# Patient Record
Sex: Male | Born: 1945 | Race: White | Hispanic: No | Marital: Married | Smoking: Never smoker
Health system: Southern US, Community
[De-identification: ages and names within clinical notes are randomized; demographics above are authoritative.]

## PROBLEM LIST (undated history)

## (undated) DIAGNOSIS — C801 Malignant (primary) neoplasm, unspecified: Secondary | ICD-10-CM

## (undated) DIAGNOSIS — N200 Calculus of kidney: Secondary | ICD-10-CM

## (undated) DIAGNOSIS — Z973 Presence of spectacles and contact lenses: Secondary | ICD-10-CM

## (undated) DIAGNOSIS — M1990 Unspecified osteoarthritis, unspecified site: Secondary | ICD-10-CM

## (undated) DIAGNOSIS — J302 Other seasonal allergic rhinitis: Secondary | ICD-10-CM

## (undated) HISTORY — PX: COLONOSCOPY: SHX174

## (undated) HISTORY — PX: SKIN CANCER EXCISION: SHX779

## (undated) HISTORY — PX: RECONSTRUCTION OF NOSE: SHX2301

## (undated) HISTORY — PX: BACK SURGERY: SHX140

---

## 2006-08-17 ENCOUNTER — Ambulatory Visit: Payer: Self-pay | Admitting: Family Medicine

## 2007-05-19 ENCOUNTER — Ambulatory Visit: Payer: Self-pay | Admitting: Gastroenterology

## 2007-10-28 ENCOUNTER — Ambulatory Visit: Payer: Self-pay | Admitting: Specialist

## 2007-11-04 ENCOUNTER — Ambulatory Visit: Payer: Self-pay | Admitting: Specialist

## 2008-01-09 IMAGING — CR DG KNEE COMPLETE 4+V*R*
1 series · 4 of 4 positions shown · non-contrast
Comparison: none

REASON FOR EXAM: Contusion and sprain-Fax reort to Gery Cinco, RN
[phone]
COMMENTS:

PROCEDURE:     DXR - DXR KNEE RT COMP WITH OBLIQUES  - August 17, 2006  [DATE]
RESULT:     AP, lateral and oblique views were obtained. No fracture,
dislocation or other acute bony abnormality is seen. The knee joint space is
well maintained. The patella is intact.

[Series 1: view not recorded · 0.17mm/px · 4 of 4 slices shown]
[im 1/4]
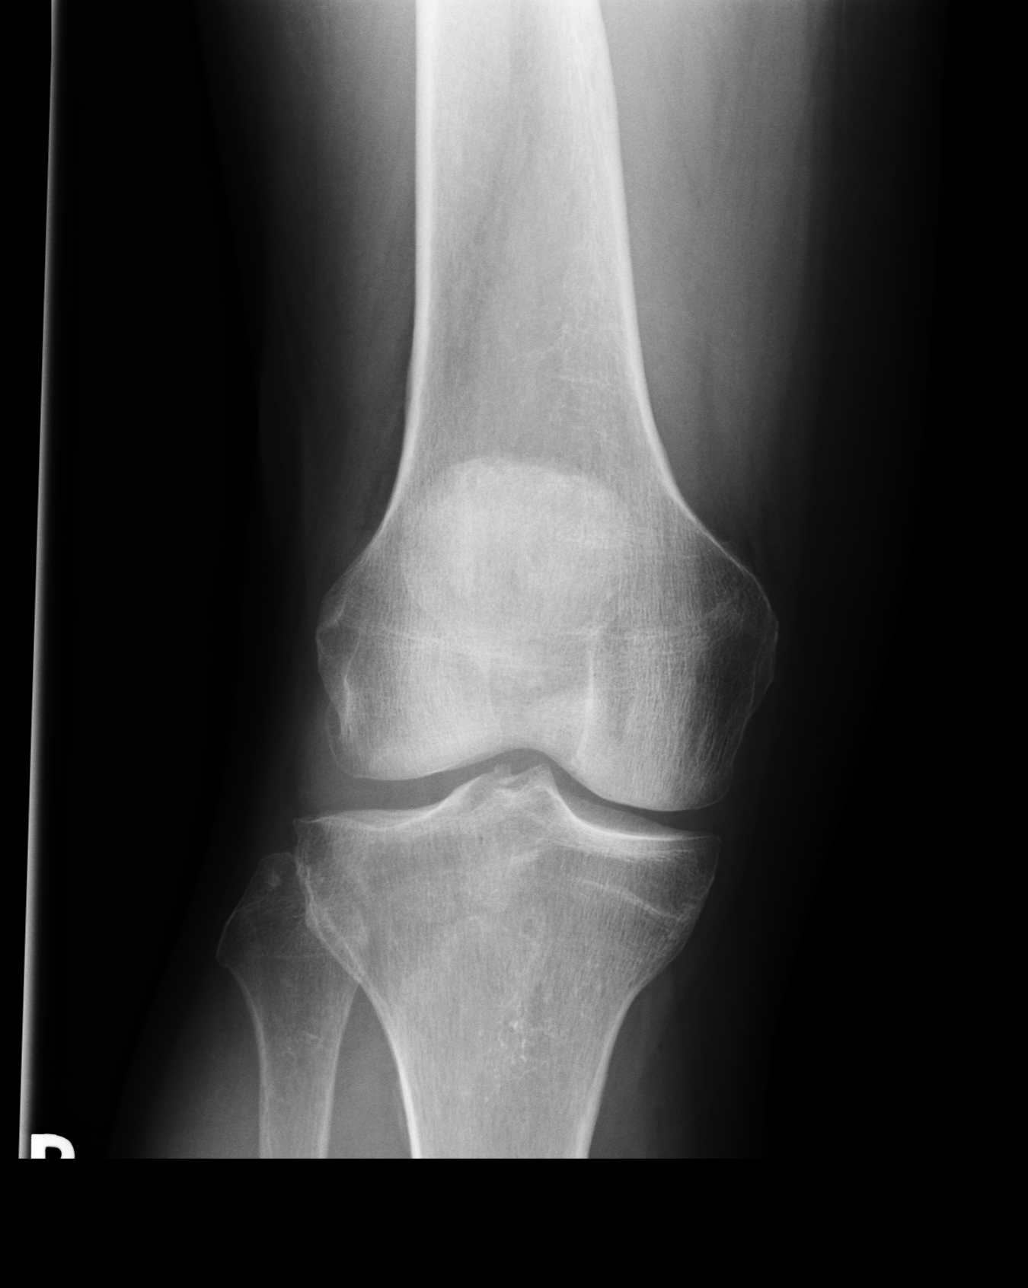
[im 2/4]
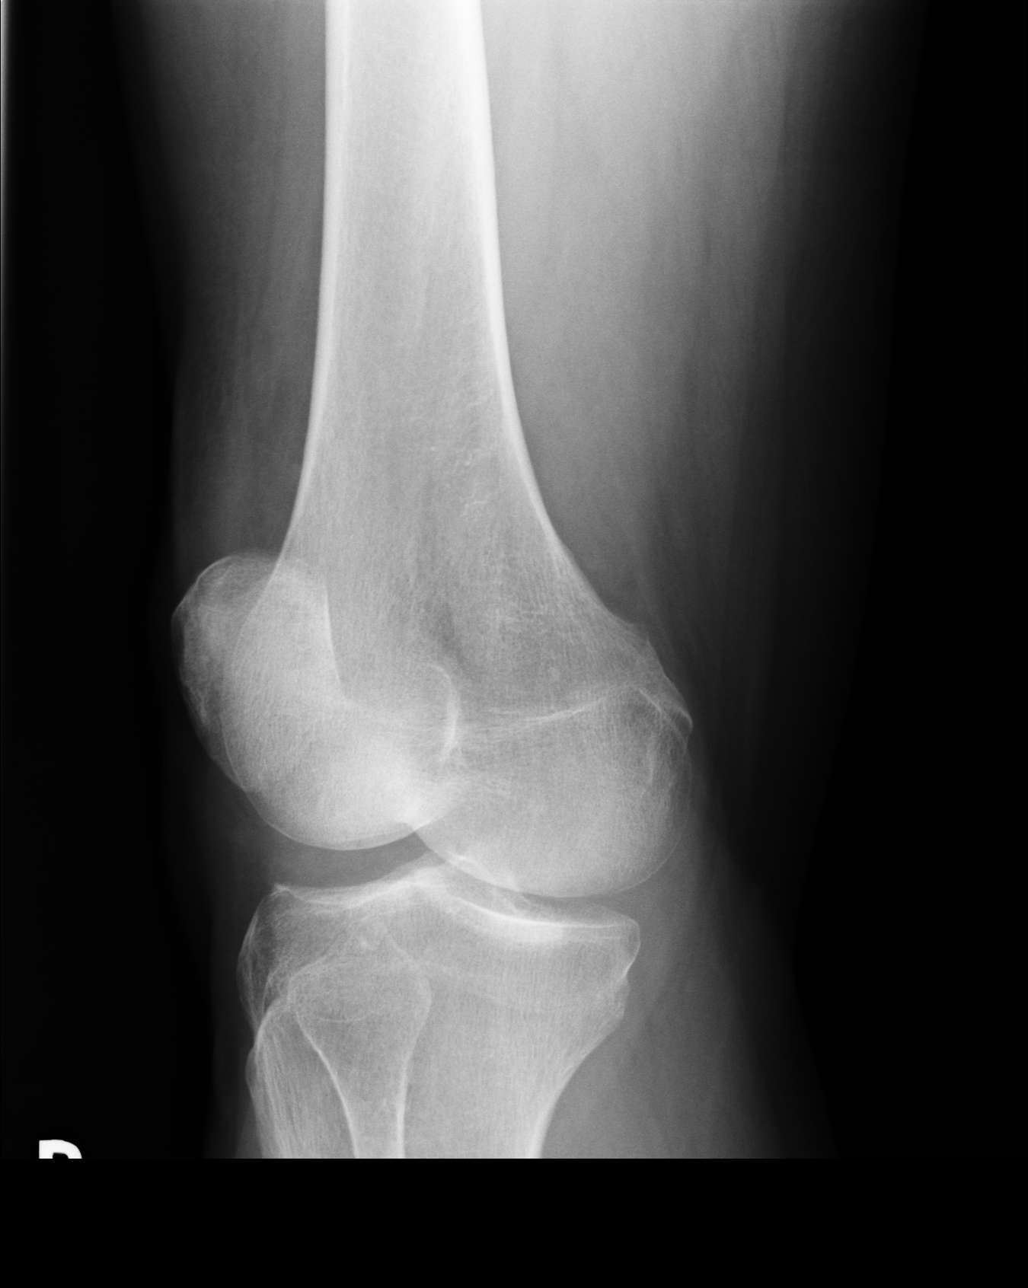
[im 3/4]
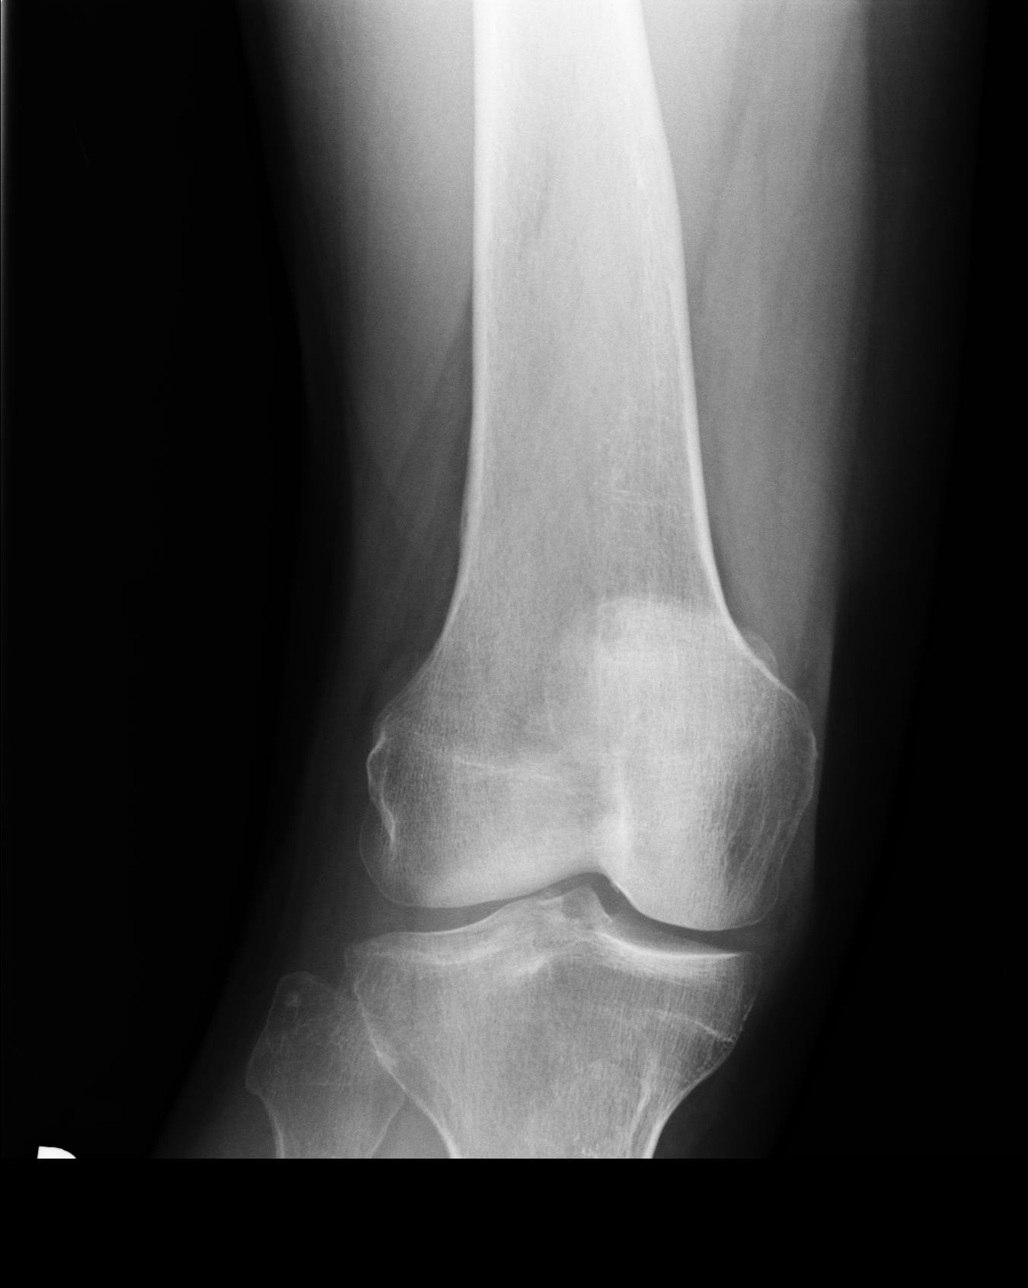
[im 4/4]
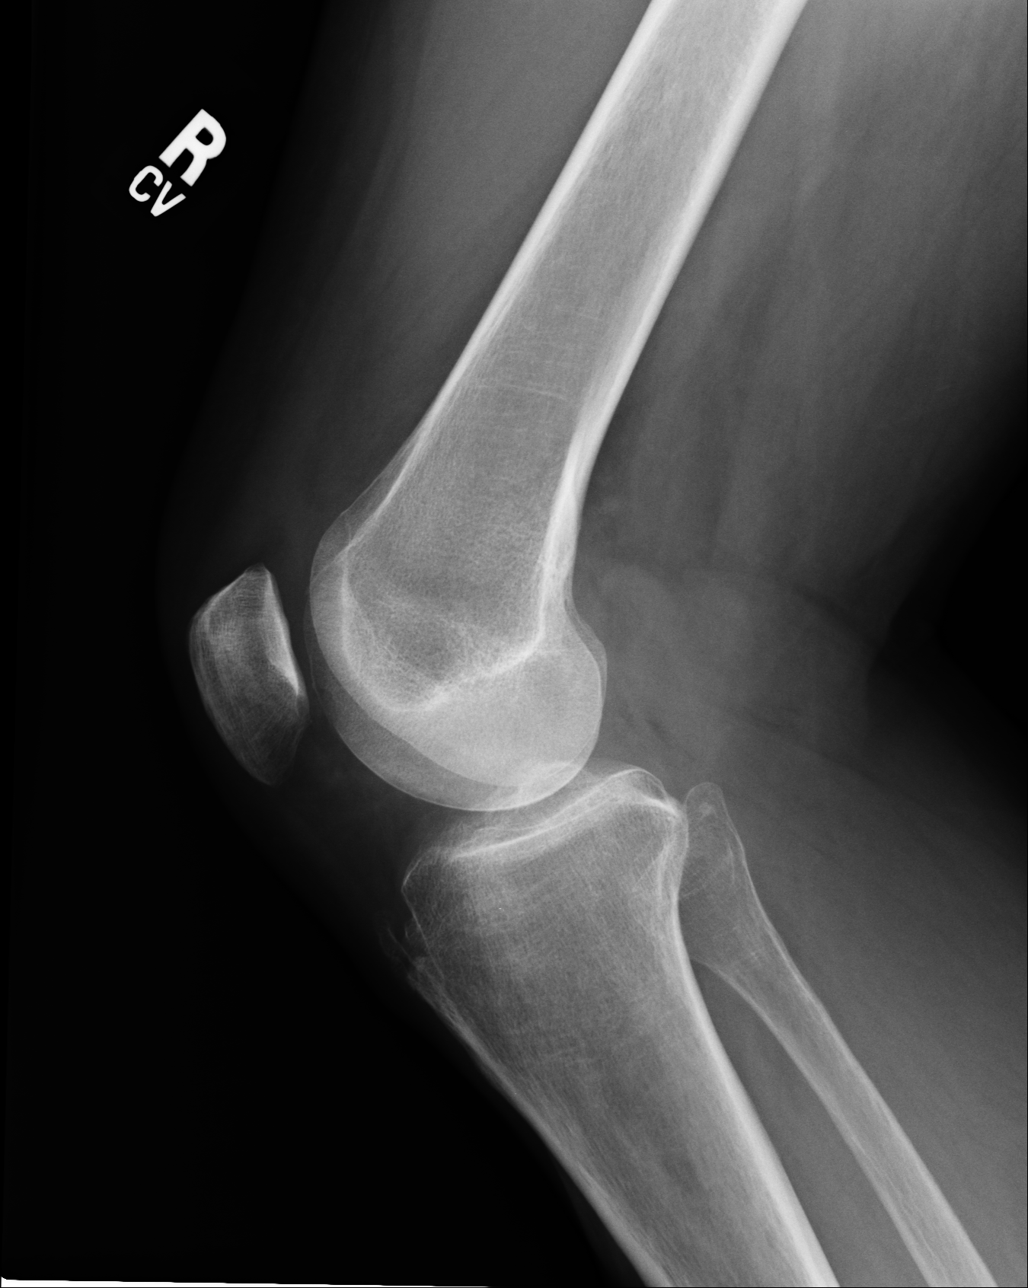

[4 of 4 positions shown; findings below may reference images not displayed]

IMPRESSION: No acute bony abnormalities are identified.

## 2014-09-13 DIAGNOSIS — R0789 Other chest pain: Secondary | ICD-10-CM | POA: Insufficient documentation

## 2016-04-04 DIAGNOSIS — L821 Other seborrheic keratosis: Secondary | ICD-10-CM | POA: Diagnosis not present

## 2016-04-04 DIAGNOSIS — Z85828 Personal history of other malignant neoplasm of skin: Secondary | ICD-10-CM | POA: Diagnosis not present

## 2016-04-04 DIAGNOSIS — D485 Neoplasm of uncertain behavior of skin: Secondary | ICD-10-CM | POA: Diagnosis not present

## 2016-06-05 DIAGNOSIS — H2513 Age-related nuclear cataract, bilateral: Secondary | ICD-10-CM | POA: Diagnosis not present

## 2016-06-23 DIAGNOSIS — N200 Calculus of kidney: Secondary | ICD-10-CM | POA: Diagnosis not present

## 2016-06-23 DIAGNOSIS — E785 Hyperlipidemia, unspecified: Secondary | ICD-10-CM | POA: Diagnosis not present

## 2016-06-23 DIAGNOSIS — J309 Allergic rhinitis, unspecified: Secondary | ICD-10-CM | POA: Diagnosis not present

## 2016-06-23 DIAGNOSIS — Z1212 Encounter for screening for malignant neoplasm of rectum: Secondary | ICD-10-CM | POA: Diagnosis not present

## 2016-06-23 DIAGNOSIS — Z0000 Encounter for general adult medical examination without abnormal findings: Secondary | ICD-10-CM | POA: Diagnosis not present

## 2016-06-23 DIAGNOSIS — M5134 Other intervertebral disc degeneration, thoracic region: Secondary | ICD-10-CM | POA: Diagnosis not present

## 2016-10-01 DIAGNOSIS — I788 Other diseases of capillaries: Secondary | ICD-10-CM | POA: Diagnosis not present

## 2017-02-25 DIAGNOSIS — H43813 Vitreous degeneration, bilateral: Secondary | ICD-10-CM | POA: Diagnosis not present

## 2017-04-03 DIAGNOSIS — D485 Neoplasm of uncertain behavior of skin: Secondary | ICD-10-CM | POA: Diagnosis not present

## 2017-04-03 DIAGNOSIS — L570 Actinic keratosis: Secondary | ICD-10-CM | POA: Diagnosis not present

## 2017-04-03 DIAGNOSIS — Z08 Encounter for follow-up examination after completed treatment for malignant neoplasm: Secondary | ICD-10-CM | POA: Diagnosis not present

## 2017-04-03 DIAGNOSIS — C44329 Squamous cell carcinoma of skin of other parts of face: Secondary | ICD-10-CM | POA: Diagnosis not present

## 2017-04-03 DIAGNOSIS — L821 Other seborrheic keratosis: Secondary | ICD-10-CM | POA: Diagnosis not present

## 2017-04-03 DIAGNOSIS — Z85828 Personal history of other malignant neoplasm of skin: Secondary | ICD-10-CM | POA: Diagnosis not present

## 2017-04-03 DIAGNOSIS — X32XXXA Exposure to sunlight, initial encounter: Secondary | ICD-10-CM | POA: Diagnosis not present

## 2017-04-10 DIAGNOSIS — C44329 Squamous cell carcinoma of skin of other parts of face: Secondary | ICD-10-CM | POA: Diagnosis not present

## 2017-04-10 DIAGNOSIS — L905 Scar conditions and fibrosis of skin: Secondary | ICD-10-CM | POA: Diagnosis not present

## 2017-06-08 DIAGNOSIS — H25813 Combined forms of age-related cataract, bilateral: Secondary | ICD-10-CM | POA: Diagnosis not present

## 2017-06-24 DIAGNOSIS — E785 Hyperlipidemia, unspecified: Secondary | ICD-10-CM | POA: Diagnosis not present

## 2017-06-24 DIAGNOSIS — Z0000 Encounter for general adult medical examination without abnormal findings: Secondary | ICD-10-CM | POA: Diagnosis not present

## 2017-06-24 DIAGNOSIS — N400 Enlarged prostate without lower urinary tract symptoms: Secondary | ICD-10-CM | POA: Diagnosis not present

## 2017-06-24 DIAGNOSIS — M5134 Other intervertebral disc degeneration, thoracic region: Secondary | ICD-10-CM | POA: Diagnosis not present

## 2017-07-01 ENCOUNTER — Other Ambulatory Visit: Payer: Self-pay

## 2017-07-01 DIAGNOSIS — Z1211 Encounter for screening for malignant neoplasm of colon: Secondary | ICD-10-CM

## 2017-07-07 ENCOUNTER — Telehealth: Payer: Self-pay | Admitting: Gastroenterology

## 2017-07-07 NOTE — Telephone Encounter (Signed)
Patient LVM that he needs to r/s his procedure on 07/17/17 with Dr. Allen Norris.

## 2017-07-08 ENCOUNTER — Telehealth: Payer: Self-pay

## 2017-07-08 NOTE — Telephone Encounter (Signed)
Rescheduled patients colonoscopy to June 17th-patient requested.  Debbie at [facility] has been informed of change.

## 2017-07-08 NOTE — Telephone Encounter (Signed)
Returned patients call to reschedule his colonoscopy.  LVM for him to call me back.

## 2017-07-10 DIAGNOSIS — Z08 Encounter for follow-up examination after completed treatment for malignant neoplasm: Secondary | ICD-10-CM | POA: Diagnosis not present

## 2017-07-10 DIAGNOSIS — L821 Other seborrheic keratosis: Secondary | ICD-10-CM | POA: Diagnosis not present

## 2017-07-10 DIAGNOSIS — Z85828 Personal history of other malignant neoplasm of skin: Secondary | ICD-10-CM | POA: Diagnosis not present

## 2017-07-17 NOTE — Discharge Instructions (Signed)
General Anesthesia, Adult, Care After °These instructions provide you with information about caring for yourself after your procedure. Your health care provider may also give you more specific instructions. Your treatment has been planned according to current medical practices, but problems sometimes occur. Call your health care provider if you have any problems or questions after your procedure. °What can I expect after the procedure? °After the procedure, it is common to have: °· Vomiting. °· A sore throat. °· Mental slowness. ° °It is common to feel: °· Nauseous. °· Cold or shivery. °· Sleepy. °· Tired. °· Sore or achy, even in parts of your body where you did not have surgery. ° °Follow these instructions at home: °For at least 24 hours after the procedure: °· Do not: °? Participate in activities where you could fall or become injured. °? Drive. °? Use heavy machinery. °? Drink alcohol. °? Take sleeping pills or medicines that cause drowsiness. °? Make important decisions or sign legal documents. °? Take care of children on your own. °· Rest. °Eating and drinking °· If you vomit, drink water, juice, or soup when you can drink without vomiting. °· Drink enough fluid to keep your urine clear or pale yellow. °· Make sure you have little or no nausea before eating solid foods. °· Follow the diet recommended by your health care provider. °General instructions °· Have a responsible adult stay with you until you are awake and alert. °· Return to your normal activities as told by your health care provider. Ask your health care provider what activities are safe for you. °· Take over-the-counter and prescription medicines only as told by your health care provider. °· If you smoke, do not smoke without supervision. °· Keep all follow-up visits as told by your health care provider. This is important. °Contact a health care provider if: °· You continue to have nausea or vomiting at home, and medicines are not helpful. °· You  cannot drink fluids or start eating again. °· You cannot urinate after 8-12 hours. °· You develop a skin rash. °· You have fever. °· You have increasing redness at the site of your procedure. °Get help right away if: °· You have difficulty breathing. °· You have chest pain. °· You have unexpected bleeding. °· You feel that you are having a life-threatening or urgent problem. °This information is not intended to replace advice given to you by your health care provider. Make sure you discuss any questions you have with your health care provider. °Document Released: 04/28/2000 Document Revised: 06/25/2015 Document Reviewed: 01/04/2015 °Elsevier Interactive Patient Education © 2018 [employer] ° °

## 2017-07-20 ENCOUNTER — Ambulatory Visit: Payer: PPO | Admitting: Anesthesiology

## 2017-07-20 ENCOUNTER — Ambulatory Visit
Admission: RE | Admit: 2017-07-20 | Discharge: 2017-07-20 | Disposition: A | Discharge status: Home / Self Care | Payer: PPO | Source: Ambulatory Visit | Attending: Gastroenterology | Admitting: Gastroenterology

## 2017-07-20 ENCOUNTER — Encounter
Admission: RE | Disposition: A | Discharge status: Home / Self Care | Payer: Self-pay | Source: Ambulatory Visit | Attending: Gastroenterology

## 2017-07-20 ENCOUNTER — Telehealth: Payer: Self-pay | Admitting: Gastroenterology

## 2017-07-20 ENCOUNTER — Other Ambulatory Visit: Payer: Self-pay

## 2017-07-20 DIAGNOSIS — R0683 Snoring: Secondary | ICD-10-CM | POA: Diagnosis not present

## 2017-07-20 DIAGNOSIS — M19012 Primary osteoarthritis, left shoulder: Secondary | ICD-10-CM | POA: Diagnosis not present

## 2017-07-20 DIAGNOSIS — Z87442 Personal history of urinary calculi: Secondary | ICD-10-CM | POA: Diagnosis not present

## 2017-07-20 DIAGNOSIS — K621 Rectal polyp: Secondary | ICD-10-CM

## 2017-07-20 DIAGNOSIS — Z1211 Encounter for screening for malignant neoplasm of colon: Secondary | ICD-10-CM

## 2017-07-20 DIAGNOSIS — K635 Polyp of colon: Secondary | ICD-10-CM | POA: Diagnosis not present

## 2017-07-20 HISTORY — PX: POLYPECTOMY: SHX5525

## 2017-07-20 HISTORY — DX: Unspecified osteoarthritis, unspecified site: M19.90

## 2017-07-20 HISTORY — DX: Other seasonal allergic rhinitis: J30.2

## 2017-07-20 HISTORY — DX: Calculus of kidney: N20.0

## 2017-07-20 HISTORY — DX: Presence of spectacles and contact lenses: Z97.3

## 2017-07-20 HISTORY — PX: COLONOSCOPY WITH PROPOFOL: SHX5780

## 2017-07-20 SURGERY — COLONOSCOPY WITH PROPOFOL
Anesthesia: General | Site: Rectum | Wound class: Contaminated

## 2017-07-20 MED ORDER — SODIUM CHLORIDE 0.9 % IV SOLN: INTRAVENOUS | Status: DC

## 2017-07-20 MED ORDER — ONDANSETRON HCL 4 MG/2ML IJ SOLN: 4.0000 mg | Freq: Once | INTRAMUSCULAR | Status: DC | PRN

## 2017-07-20 MED ORDER — LACTATED RINGERS IV SOLN
10.0000 mL/h | INTRAVENOUS | Status: DC
  Administered 2017-07-20: 10 mL/h via INTRAVENOUS

## 2017-07-20 MED ORDER — LIDOCAINE HCL (CARDIAC) PF 100 MG/5ML IV SOSY
PREFILLED_SYRINGE | INTRAVENOUS | Status: DC | PRN
  Administered 2017-07-20: 80 mg via INTRAVENOUS

## 2017-07-20 MED ORDER — STERILE WATER FOR IRRIGATION IR SOLN
Status: DC | PRN
  Administered 2017-07-20: .5 mL

## 2017-07-20 MED ORDER — PROPOFOL 10 MG/ML IV BOLUS
INTRAVENOUS | Status: DC | PRN
Start: 2017-07-20 — End: 2017-07-20
  Administered 2017-07-20 (×7): 40 mg via INTRAVENOUS
  Administered 2017-07-20: 80 mg via INTRAVENOUS
  Administered 2017-07-20 (×2): 40 mg via INTRAVENOUS

## 2017-07-20 SURGICAL SUPPLY — 8 items
CANISTER SUCT 1200ML W/VALVE (MISCELLANEOUS) ×4 IMPLANT
FORCEPS BIOP RAD 4 LRG CAP 4 (CUTTING FORCEPS) ×4 IMPLANT
GOWN CVR UNV OPN BCK APRN NK (MISCELLANEOUS) ×4 IMPLANT
GOWN ISOL THUMB LOOP REG UNIV (MISCELLANEOUS) ×4
KIT ENDO PROCEDURE OLY (KITS) ×4 IMPLANT
SNARE SHORT THROW 13M SML OVAL (MISCELLANEOUS) ×4 IMPLANT
TRAP ETRAP POLY (MISCELLANEOUS) ×4 IMPLANT
WATER STERILE IRR 250ML POUR (IV SOLUTION) ×4 IMPLANT

## 2017-07-20 NOTE — Anesthesia Postprocedure Evaluation (Signed)
Anesthesia Post Note  Patient: Paul James  Procedure(s) Performed: COLONOSCOPY WITH PROPOFOL (N/A Rectum) POLYPECTOMY (Rectum)  Patient location during evaluation: PACU Anesthesia Type: General Level of consciousness: awake and alert, oriented and patient cooperative Pain management: pain level controlled Vital Signs Assessment: post-procedure vital signs reviewed and stable Respiratory status: spontaneous breathing, nonlabored ventilation and respiratory function stable Cardiovascular status: blood pressure returned to baseline and stable Postop Assessment: adequate PO intake Anesthetic complications: no    Darrin Nipper

## 2017-07-20 NOTE — Transfer of Care (Signed)
Immediate Anesthesia Transfer of Care Note  Patient: Ephrem Carrick  Procedure(s) Performed: COLONOSCOPY WITH PROPOFOL (N/A Rectum) POLYPECTOMY (Rectum)  Patient Location: PACU  Anesthesia Type: General  Level of Consciousness: awake, alert  and patient cooperative  Airway and Oxygen Therapy: Patient Spontanous Breathing and Patient connected to supplemental oxygen  Post-op Assessment: Post-op Vital signs reviewed, Patient's Cardiovascular Status Stable, Respiratory Function Stable, Patent Airway and No signs of Nausea or vomiting  Post-op Vital Signs: Reviewed and stable  Complications: No apparent anesthesia complications

## 2017-07-20 NOTE — Telephone Encounter (Signed)
Pt is calling to see if his insurance would cover this procedure he wants to make sure it is filled as a continuation of todays  Procedure that was not completed he does not want to get billed twice please call pt

## 2017-07-20 NOTE — Op Note (Signed)
[facility] Gastroenterology Patient Name: Paul James Procedure Date: 07/20/2017 10:01 AM MRN: [id number] Account #: [id number] Date of Birth: 04/07/1945 Admit Type: Outpatient Age: 72 Room: [facility] OR ROOM 01 Gender: Male Note Status: Finalized Procedure:            Colonoscopy Indications:          Screening for colorectal malignant neoplasm Providers:            Lucilla Lame MD, MD Medicines:            Propofol per Anesthesia Complications:        No immediate complications. Procedure:            Pre-Anesthesia Assessment:                       - Prior to the procedure, a History and Physical was                        performed, and patient medications and allergies were                        reviewed. The patient's tolerance of previous                        anesthesia was also reviewed. The risks and benefits of                        the procedure and the sedation options and risks were                        discussed with the patient. All questions were                        answered, and informed consent was obtained. Prior                        Anticoagulants: The patient has taken no previous                        anticoagulant or antiplatelet agents. ASA Grade                        Assessment: II - A patient with mild systemic disease.                        After reviewing the risks and benefits, the patient was                        deemed in satisfactory condition to undergo the                        procedure.                       After obtaining informed consent, the colonoscope was                        passed under direct vision. Throughout the procedure,                        the patient's blood pressure,  pulse, and oxygen                        saturations were monitored continuously. The                        Colonoscope was introduced through the anus and                        advanced to the the transverse colon. The colonoscopy                         was performed without difficulty. The patient tolerated                        the procedure well. The quality of the bowel                        preparation was poor. Findings:      The perianal and digital rectal examinations were normal.      Two polyps were found in the rectum. The polyps were 1 to 2 mm in size.       These polyps were removed with a cold biopsy forceps. Resection and       retrieval were complete.      Extensive amounts of stool was found in the entire colon, precluding       visualization. Impression:           - Preparation of the colon was poor.                       - Two 1 to 2 mm polyps in the rectum, removed with a                        cold biopsy forceps. Resected and retrieved.                       - Stool in the entire examined colon. Recommendation:       - Discharge patient to home.                       - Resume previous diet.                       - Continue present medications.                       - Await pathology results.                       - Repeat colonoscopy at appointment to be scheduled                        because the bowel preparation was poor. Procedure Code(s):    --- Professional ---                       [phone], 3, Colonoscopy, flexible; with biopsy, single                        or multiple Diagnosis Code(s):    --- Professional ---  Z12.11, Encounter for screening for malignant neoplasm                        of colon                       K62.1, Rectal polyp CPT copyright 2017 American Medical Association. All rights reserved. The codes documented in this report are preliminary and upon coder review may  be revised to meet current compliance requirements. Lucilla Lame MD, MD 07/20/2017 10:27:25 AM This report has been signed electronically. Number of Addenda: 0 Note Initiated On: 07/20/2017 10:01 AM Total Procedure Duration: 0 hours 14 minutes 10 seconds       [facility]

## 2017-07-20 NOTE — Anesthesia Procedure Notes (Signed)
Procedure Name: MAC Date/Time: 07/20/2017 10:06 AM Performed by: Lind Guest, CRNA Pre-anesthesia Checklist: Patient identified, Emergency Drugs available, Suction available, Patient being monitored and Timeout performed Patient Re-evaluated:Patient Re-evaluated prior to induction Oxygen Delivery Method: Nasal cannula

## 2017-07-20 NOTE — H&P (Signed)
Paul Lame, MD [facility] [address], [place], [place] [address] Phone: [phone] Fax : [phone]  Primary Care Physician:  Paul Billet, MD Primary Gastroenterologist:  Dr. Allen Norris  Pre-Procedure History & Physical: HPI:  Paul James is a 72 y.o. male is here for a screening colonoscopy.   Past Medical History:  Diagnosis Date  . Arthritis    left shoulder  . Kidney stones   . Seasonal allergies   . Wears glasses     Past Surgical History:  Procedure Laterality Date  . BACK SURGERY    . COLONOSCOPY      Prior to Admission medications   Medication Sig Start Date End Date Taking? Authorizing Provider  co-enzyme Q-10 30 MG capsule Take 30 mg by mouth 3 (three) times daily.   Yes [provider]  RED YEAST RICE EXTRACT PO Take by mouth daily.   Yes [provider]    Allergies as of 07/01/2017  . (Not on File)    History reviewed. No pertinent family history.  Social History   Socioeconomic History  . Marital status: Married    Spouse name: Not on file  . Number of children: Not on file  . Years of education: Not on file  . Highest education level: Not on file  Occupational History  . Not on file  Social Needs  . Financial resource strain: Not on file  . Food insecurity:    Worry: Not on file    Inability: Not on file  . Transportation needs:    Medical: Not on file    Non-medical: Not on file  Tobacco Use  . Smoking status: Never Smoker  . Smokeless tobacco: Never Used  Substance and Sexual Activity  . Alcohol use: Yes    Comment: rarely  . Drug use: Not on file  . Sexual activity: Not on file  Lifestyle  . Physical activity:    Days per week: Not on file    Minutes per session: Not on file  . Stress: Not on file  Relationships  . Social connections:    Talks on phone: Not on file    Gets together: Not on file    Attends religious service: Not on file    Active member of club or organization: Not on file   Attends meetings of clubs or organizations: Not on file    Relationship status: Not on file  . Intimate partner violence:    Fear of current or ex partner: Not on file    Emotionally abused: Not on file    Physically abused: Not on file    Forced sexual activity: Not on file  Other Topics Concern  . Not on file  Social History Narrative  . Not on file    Review of Systems: See HPI, otherwise negative ROS  Physical Exam: BP 107/67   Pulse 60   Temp 98.1 F (36.7 C) (Temporal)   Resp 16   Ht 5\' 11"  (1.803 m)   Wt 190 lb (86.2 kg)   SpO2 100%   BMI 26.50 kg/m  General:   Alert,  pleasant and cooperative in NAD Head:  Normocephalic and atraumatic. Neck:  Supple; no masses or thyromegaly. Lungs:  Clear throughout to auscultation.    Heart:  Regular rate and rhythm. Abdomen:  Soft, nontender and nondistended. Normal bowel sounds, without guarding, and without rebound.   Neurologic:  Alert and  oriented x4;  grossly normal neurologically.  Impression/Plan: Paul James  is now here to undergo a screening colonoscopy.  Risks, benefits, and alternatives regarding colonoscopy have been reviewed with the patient.  Questions have been answered.  All parties agreeable.

## 2017-07-20 NOTE — Anesthesia Preprocedure Evaluation (Signed)
Anesthesia Evaluation  Patient identified by MRN, date of birth, ID band Patient awake    Reviewed: Allergy & Precautions, NPO status , Patient's Chart, lab work & pertinent test results  History of Anesthesia Complications Negative for: history of anesthetic complications  Airway Mallampati: I  TM Distance: >3 FB Neck ROM: Full    Dental no notable dental hx.    Pulmonary  Snoring    Pulmonary exam normal breath sounds clear to auscultation       Cardiovascular Exercise Tolerance: Good negative cardio ROS Normal cardiovascular exam Rhythm:Regular Rate:Normal     Neuro/Psych negative neurological ROS     GI/Hepatic negative GI ROS,   Endo/Other  negative endocrine ROS  Renal/GU Renal disease (nephrolithiasis)     Musculoskeletal  (+) Arthritis , Osteoarthritis,    Abdominal   Peds  Hematology negative hematology ROS (+)   Anesthesia Other Findings   Reproductive/Obstetrics                             Anesthesia Physical Anesthesia Plan  ASA: II  Anesthesia Plan: General   Post-op Pain Management:    Induction: Intravenous  PONV Risk Score and Plan: 2 and Propofol infusion and TIVA  Airway Management Planned: Natural Airway  Additional Equipment:   Intra-op Plan:   Post-operative Plan:   Informed Consent: I have reviewed the patients History and Physical, chart, labs and discussed the procedure including the risks, benefits and alternatives for the proposed anesthesia with the patient or authorized representative who has indicated his/her understanding and acceptance.     Plan Discussed with: CRNA  Anesthesia Plan Comments:         Anesthesia Quick Evaluation

## 2017-07-21 ENCOUNTER — Encounter: Payer: Self-pay | Admitting: Anesthesiology

## 2017-07-21 ENCOUNTER — Ambulatory Visit: Payer: PPO | Admitting: Anesthesiology

## 2017-07-21 ENCOUNTER — Encounter: Payer: Self-pay | Admitting: Gastroenterology

## 2017-07-21 ENCOUNTER — Other Ambulatory Visit: Payer: Self-pay

## 2017-07-21 ENCOUNTER — Ambulatory Visit
Admission: RE | Admit: 2017-07-21 | Discharge: 2017-07-21 | Disposition: A | Discharge status: Home / Self Care | Payer: PPO | Source: Ambulatory Visit | Attending: Gastroenterology | Admitting: Gastroenterology

## 2017-07-21 ENCOUNTER — Encounter
Admission: RE | Disposition: A | Discharge status: Home / Self Care | Payer: Self-pay | Source: Ambulatory Visit | Attending: Gastroenterology

## 2017-07-21 DIAGNOSIS — K640 First degree hemorrhoids: Secondary | ICD-10-CM | POA: Diagnosis not present

## 2017-07-21 DIAGNOSIS — Z1211 Encounter for screening for malignant neoplasm of colon: Secondary | ICD-10-CM | POA: Diagnosis not present

## 2017-07-21 DIAGNOSIS — Z85828 Personal history of other malignant neoplasm of skin: Secondary | ICD-10-CM | POA: Insufficient documentation

## 2017-07-21 DIAGNOSIS — M19012 Primary osteoarthritis, left shoulder: Secondary | ICD-10-CM | POA: Diagnosis not present

## 2017-07-21 DIAGNOSIS — N200 Calculus of kidney: Secondary | ICD-10-CM | POA: Diagnosis not present

## 2017-07-21 HISTORY — DX: Malignant (primary) neoplasm, unspecified: C80.1

## 2017-07-21 HISTORY — PX: COLONOSCOPY WITH PROPOFOL: SHX5780

## 2017-07-21 SURGERY — COLONOSCOPY WITH PROPOFOL
Anesthesia: General

## 2017-07-21 MED ORDER — PROPOFOL 500 MG/50ML IV EMUL
INTRAVENOUS | Status: DC | PRN
  Administered 2017-07-21: 80 ug/kg/min via INTRAVENOUS

## 2017-07-21 MED ORDER — SODIUM CHLORIDE 0.9 % IV SOLN
INTRAVENOUS | Status: DC
  Administered 2017-07-21 (×2): via INTRAVENOUS

## 2017-07-21 MED ORDER — PROPOFOL 10 MG/ML IV BOLUS
INTRAVENOUS | Status: DC | PRN
  Administered 2017-07-21: 50 mg via INTRAVENOUS

## 2017-07-21 MED ORDER — LIDOCAINE HCL (CARDIAC) PF 100 MG/5ML IV SOSY
PREFILLED_SYRINGE | INTRAVENOUS | Status: DC | PRN
  Administered 2017-07-21: 50 mg via INTRAVENOUS

## 2017-07-21 MED ORDER — PROPOFOL 10 MG/ML IV BOLUS
INTRAVENOUS | Status: AC
  Filled 2017-07-21: qty 20

## 2017-07-21 MED ORDER — PROPOFOL 500 MG/50ML IV EMUL
INTRAVENOUS | Status: AC
  Filled 2017-07-21: qty 50

## 2017-07-21 NOTE — Anesthesia Preprocedure Evaluation (Signed)
Anesthesia Evaluation  Patient identified by MRN, date of birth, ID band Patient awake    Reviewed: Allergy & Precautions, NPO status , Patient's Chart, lab work & pertinent test results, reviewed documented beta blocker date and time   Airway Mallampati: II  TM Distance: >3 FB     Dental  (+) Chipped   Pulmonary           Cardiovascular      Neuro/Psych    GI/Hepatic   Endo/Other    Renal/GU Renal disease     Musculoskeletal  (+) Arthritis ,   Abdominal   Peds  Hematology   Anesthesia Other Findings   Reproductive/Obstetrics                             Anesthesia Physical Anesthesia Plan  ASA: III  Anesthesia Plan: General   Post-op Pain Management:    Induction: Intravenous  PONV Risk Score and Plan:   Airway Management Planned:   Additional Equipment:   Intra-op Plan:   Post-operative Plan:   Informed Consent: I have reviewed the patients History and Physical, chart, labs and discussed the procedure including the risks, benefits and alternatives for the proposed anesthesia with the patient or authorized representative who has indicated his/her understanding and acceptance.     Plan Discussed with: CRNA  Anesthesia Plan Comments:         Anesthesia Quick Evaluation

## 2017-07-21 NOTE — Transfer of Care (Signed)
Immediate Anesthesia Transfer of Care Note  Patient: Paul James  Procedure(s) Performed: COLONOSCOPY WITH PROPOFOL (N/A )  Patient Location: PACU  Anesthesia Type:MAC  Level of Consciousness: awake, alert  and oriented  Airway & Oxygen Therapy: Patient Spontanous Breathing and Patient connected to nasal cannula oxygen  Post-op Assessment: Report given to RN and Post -op Vital signs reviewed and stable  Post vital signs: stable  Last Vitals:  Vitals Value Taken Time  BP    Temp    Pulse 55 07/21/2017  7:57 AM  Resp 15 07/21/2017  7:57 AM  SpO2 100 % 07/21/2017  7:57 AM  Vitals shown include unvalidated device data.  Last Pain:  Vitals:   07/21/17 0757  TempSrc: (P) Tympanic  PainSc:          Complications: No apparent anesthesia complications

## 2017-07-21 NOTE — Anesthesia Post-op Follow-up Note (Signed)
Anesthesia QCDR form completed.        

## 2017-07-21 NOTE — H&P (Signed)
Lucilla Lame, MD [facility] [address], [place], [place] [address] Phone: [phone] Fax : [phone]  Primary Care Physician:  Albina Billet, MD Primary Gastroenterologist:  Dr. Allen Norris  Pre-Procedure History & Physical: HPI:  Paul James is a 72 y.o. male is here for a screening colonoscopy.   Past Medical History:  Diagnosis Date  . Arthritis    left shoulder  . Cancer ([place])    skin  . Kidney stones   . Seasonal allergies   . Wears glasses     Past Surgical History:  Procedure Laterality Date  . BACK SURGERY    . COLONOSCOPY    . RECONSTRUCTION OF NOSE     broken  . SKIN CANCER EXCISION      Prior to Admission medications   Medication Sig Start Date End Date Taking? Authorizing Provider  co-enzyme Q-10 30 MG capsule Take 30 mg by mouth 3 (three) times daily.   Yes [provider]  RED YEAST RICE EXTRACT PO Take by mouth daily.   Yes [provider]    Allergies as of 07/20/2017  . (No Known Allergies)    History reviewed. No pertinent family history.  Social History   Socioeconomic History  . Marital status: Married    Spouse name: Not on file  . Number of children: Not on file  . Years of education: Not on file  . Highest education level: Not on file  Occupational History  . Not on file  Social Needs  . Financial resource strain: Not on file  . Food insecurity:    Worry: Not on file    Inability: Not on file  . Transportation needs:    Medical: Not on file    Non-medical: Not on file  Tobacco Use  . Smoking status: Never Smoker  . Smokeless tobacco: Never Used  Substance and Sexual Activity  . Alcohol use: Yes    Comment: rarely  . Drug use: Never  . Sexual activity: Not on file  Lifestyle  . Physical activity:    Days per week: Not on file    Minutes per session: Not on file  . Stress: Not on file  Relationships  . Social connections:    Talks on phone: Not on file    Gets together: Not on file    Attends  religious service: Not on file    Active member of club or organization: Not on file    Attends meetings of clubs or organizations: Not on file    Relationship status: Not on file  . Intimate partner violence:    Fear of current or ex partner: Not on file    Emotionally abused: Not on file    Physically abused: Not on file    Forced sexual activity: Not on file  Other Topics Concern  . Not on file  Social History Narrative  . Not on file    Review of Systems: See HPI, otherwise negative ROS  Physical Exam: BP 130/71   Pulse 60   Temp 97.9 F (36.6 C) (Tympanic)   Resp 15   Ht 5\' 11"  (1.803 m)   Wt 195 lb (88.5 kg)   SpO2 100%   BMI 27.20 kg/m  General:   Alert,  pleasant and cooperative in NAD Head:  Normocephalic and atraumatic. Neck:  Supple; no masses or thyromegaly. Lungs:  Clear throughout to auscultation.    Heart:  Regular rate and rhythm. Abdomen:  Soft, nontender and nondistended.  Normal bowel sounds, without guarding, and without rebound.   Neurologic:  Alert and  oriented x4;  grossly normal neurologically.  Impression/Plan: Paul James is now here to undergo a screening colonoscopy.  Risks, benefits, and alternatives regarding colonoscopy have been reviewed with the patient.  Questions have been answered.  All parties agreeable.

## 2017-07-21 NOTE — Op Note (Signed)
[facility] Gastroenterology Patient Name: Paul James Procedure Date: 07/21/2017 7:30 AM MRN: [id number] Account #: [id number] Date of Birth: 11/15/1945 Admit Type: Outpatient Age: 72 Room: [facility] ENDO ROOM 4 Gender: Male Note Status: Finalized Procedure:            Colonoscopy Indications:          Screening for colorectal malignant neoplasm Providers:            Lucilla Lame MD, MD Referring MD:         Leona Carry. Hall Busing, MD (Referring MD) Medicines:            Propofol per Anesthesia Complications:        No immediate complications. Procedure:            Pre-Anesthesia Assessment:                       - Prior to the procedure, a History and Physical was                        performed, and patient medications and allergies were                        reviewed. The patient's tolerance of previous                        anesthesia was also reviewed. The risks and benefits of                        the procedure and the sedation options and risks were                        discussed with the patient. All questions were                        answered, and informed consent was obtained. Prior                        Anticoagulants: The patient has taken no previous                        anticoagulant or antiplatelet agents. ASA Grade                        Assessment: II - A patient with mild systemic disease.                        After reviewing the risks and benefits, the patient was                        deemed in satisfactory condition to undergo the                        procedure.                       After obtaining informed consent, the colonoscope was                        passed under direct vision. Throughout the procedure,  the patient's blood pressure, pulse, and oxygen                        saturations were monitored continuously. The                        Colonoscope was introduced through the anus and    advanced to the the cecum, identified by appendiceal                        orifice and ileocecal valve. The colonoscopy was                        performed without difficulty. The patient tolerated the                        procedure well. The quality of the bowel preparation                        was excellent. Findings:      The perianal and digital rectal examinations were normal.      Non-bleeding internal hemorrhoids were found during retroflexion. The       hemorrhoids were Grade II (internal hemorrhoids that prolapse but reduce       spontaneously).      The previous polypectomy site was seen in the rectum. Impression:           - Non-bleeding internal hemorrhoids.                       - The previous polypectomy site was seen in the rectum.                       - No specimens collected. Recommendation:       - Discharge patient to home.                       - Resume previous diet.                       - Continue present medications. Procedure Code(s):    --- Professional ---                       [phone], Colonoscopy, flexible; diagnostic, including                        collection of specimen(s) by brushing or washing, when                        performed (separate procedure) Diagnosis Code(s):    --- Professional ---                       Z12.11, Encounter for screening for malignant neoplasm                        of colon CPT copyright 2017 American Medical Association. All rights reserved. The codes documented in this report are preliminary and upon coder review may  be revised to meet current compliance requirements. Lucilla Lame MD, MD 07/21/2017 7:56:54 AM This report has been signed electronically. Number of Addenda: 0 Note Initiated On: 07/21/2017 7:30 AM  Scope Withdrawal Time: 0 hours 8 minutes 30 seconds  Total Procedure Duration: 0 hours 15 minutes 41 seconds       [facility]

## 2017-07-21 NOTE — Anesthesia Postprocedure Evaluation (Signed)
Anesthesia Post Note  Patient: Paul James  Procedure(s) Performed: COLONOSCOPY WITH PROPOFOL (N/A )  Patient location during evaluation: Endoscopy Anesthesia Type: General Level of consciousness: awake and alert Pain management: pain level controlled Vital Signs Assessment: post-procedure vital signs reviewed and stable Respiratory status: spontaneous breathing, nonlabored ventilation, respiratory function stable and patient connected to nasal cannula oxygen Cardiovascular status: blood pressure returned to baseline and stable Postop Assessment: no apparent nausea or vomiting Anesthetic complications: no     Last Vitals:  Vitals:   07/21/17 0757 07/21/17 0832  BP:    Pulse:    Resp: 16   Temp: (!) 36.1 C   SpO2:  100%    Last Pain:  Vitals:   07/21/17 0832  TempSrc:   PainSc: 0-No pain                 Ryot Burrous S

## 2017-07-22 ENCOUNTER — Encounter: Payer: Self-pay | Admitting: Gastroenterology

## 2019-11-10 ENCOUNTER — Other Ambulatory Visit: Payer: Medicare HMO

## 2019-11-10 DIAGNOSIS — Z20822 Contact with and (suspected) exposure to covid-19: Secondary | ICD-10-CM

## 2019-11-11 LAB — NOVEL CORONAVIRUS, NAA: SARS-CoV-2, NAA: NOT DETECTED

## 2019-11-11 LAB — SARS-COV-2, NAA 2 DAY TAT

## 2019-11-15 ENCOUNTER — Other Ambulatory Visit: Payer: Self-pay

## 2019-11-15 ENCOUNTER — Other Ambulatory Visit: Payer: Medicare HMO

## 2019-11-15 DIAGNOSIS — Z20822 Contact with and (suspected) exposure to covid-19: Secondary | ICD-10-CM

## 2019-11-17 LAB — NOVEL CORONAVIRUS, NAA: SARS-CoV-2, NAA: NOT DETECTED

## 2019-11-17 LAB — SARS-COV-2, NAA 2 DAY TAT

## 2021-08-18 ENCOUNTER — Ambulatory Visit
Admission: EM | Admit: 2021-08-18 | Discharge: 2021-08-18 | Disposition: A | Discharge status: Home / Self Care | Payer: Medicare HMO

## 2021-08-18 DIAGNOSIS — L03116 Cellulitis of left lower limb: Secondary | ICD-10-CM

## 2021-08-18 MED ORDER — DOXYCYCLINE HYCLATE 100 MG PO CAPS: 100.0000 mg | ORAL_CAPSULE | Freq: Two times a day (BID) | ORAL | 0 refills | Status: DC

## 2021-08-18 NOTE — Discharge Instructions (Addendum)
Take the Doxycycline twice daily with food for 10 days.  Doxycycline will make you more sensitive to sunburn so wear sunscreen when outdoors and reapply it every 90 minutes.  Keep an eye on the redness and monitor for any increases. If the area becomes hard or spongy return for re-evaluation.  Use OTC Tylenol and Ibuprofen according to the package instructions as needed for pain.  Return for new or worsening symptoms.  Such as joint pain, headache, or fever.

## 2021-08-18 NOTE — ED Triage Notes (Signed)
Patient reports that that something bite him on his left inner thigh.   Patient noticed the rash Thursday.  Patient states that he drew a circle around the rash yesterday and this AM it has spread outside the circle.   Patient reports it is a little warn to touch.

## 2021-08-18 NOTE — ED Provider Notes (Signed)
[facility]    CSN: [id number] Arrival date & time: 08/18/21  1412      History   Chief Complaint Chief Complaint  Patient presents with   Rash   Insect Bite    HPI Paul James is a 76 y.o. male.   HPI  76 year old male here for evaluation of skin complaint.  Patient reports that he noticed a red circular area on the inside of his left upper thigh 3 days ago.  He states that he drew a circle around it and then noticed that the redness had spread beyond the circle this morning.  He states that it is a little warm to touch but she denies any fevers or drainage from the area.  He did not feel any bite and he does not member pulling any ticks off of him.  The redness is oval in shape and does not appear to be in concentric rings.  Past Medical History:  Diagnosis Date   Arthritis    left shoulder   Cancer ([place])    skin   Kidney stones    Seasonal allergies    Wears glasses     Patient Active Problem List   Diagnosis Date Noted   Colon cancer screening    Rectal polyp     Past Surgical History:  Procedure Laterality Date   BACK SURGERY     COLONOSCOPY     COLONOSCOPY WITH PROPOFOL N/A 07/20/2017   Procedure: COLONOSCOPY WITH PROPOFOL;  Surgeon: Lucilla Lame, MD;  Location: [place];  Service: Endoscopy;  Laterality: N/A;   COLONOSCOPY WITH PROPOFOL N/A 07/21/2017   Procedure: COLONOSCOPY WITH PROPOFOL;  Surgeon: Lucilla Lame, MD;  Location: [facility] ENDOSCOPY;  Service: Endoscopy;  Laterality: N/A;   POLYPECTOMY  07/20/2017   Procedure: POLYPECTOMY;  Surgeon: Lucilla Lame, MD;  Location: [facility];  Service: Endoscopy;;   RECONSTRUCTION OF NOSE     broken   SKIN CANCER EXCISION         Home Medications    Prior to Admission medications   Medication Sig Start Date End Date Taking? Authorizing Provider  co-enzyme Q-10 30 MG capsule Take 30 mg by mouth 3 (three) times daily.   Yes [provider]  doxycycline (VIBRAMYCIN)  100 MG capsule Take 1 capsule (100 mg total) by mouth 2 (two) times daily. 08/18/21  Yes Margarette Canada, NP  RED YEAST RICE EXTRACT PO Take by mouth daily.   Yes [provider]  rosuvastatin (CRESTOR) 10 MG tablet Take 10 mg by mouth daily. 07/05/21  Yes [provider]    Family History History reviewed. No pertinent family history.  Social History Social History   Tobacco Use   Smoking status: Never   Smokeless tobacco: Never  Vaping Use   Vaping Use: Never used  Substance Use Topics   Alcohol use: Yes    Comment: rarely   Drug use: Never     Allergies   Patient has no known allergies.   Review of Systems Review of Systems  Constitutional:  Negative for fever.  Skin:  Positive for color change and rash.  Hematological: Negative.   Psychiatric/Behavioral: Negative.       Physical Exam Triage Vital Signs ED Triage Vitals [08/18/21 1420]  Enc Vitals Group     BP      Pulse      Resp      Temp      Temp src      SpO2  Weight 195 lb (88.5 kg)     Height '5\' 11"'$  (1.803 m)     Head Circumference      Peak Flow      Pain Score 0     Pain Loc      Pain Edu?      Excl. in [place]?    No data found.  Updated Vital Signs BP 129/76 (BP Location: Left Arm)   Pulse 63   Temp 98.3 F (36.8 C) (Oral)   Ht '5\' 11"'$  (1.803 m)   Wt 195 lb (88.5 kg)   SpO2 98%   BMI 27.20 kg/m   Visual Acuity Right Eye Distance:   Left Eye Distance:   Bilateral Distance:    Right Eye Near:   Left Eye Near:    Bilateral Near:     Physical Exam Vitals and nursing note reviewed.  Constitutional:      Appearance: Normal appearance. He is not ill-appearing.  Skin:    General: Skin is warm and dry.     Capillary Refill: Capillary refill takes less than 2 seconds.     Findings: Erythema present.  Neurological:     General: No focal deficit present.     Mental Status: He is alert and oriented to person, place, and time.  Psychiatric:        Mood and Affect: Mood  normal.        Behavior: Behavior normal.        Thought Content: Thought content normal.        Judgment: Judgment normal.      UC Treatments / Results  Labs (all labs ordered are listed, but only abnormal results are displayed) Labs Reviewed - No data to display  EKG   Radiology No results found.  Procedures Procedures (including critical care time)  Medications Ordered in UC Medications - No data to display  Initial Impression / Assessment and Plan / UC Course  I have reviewed the triage vital signs and the nursing notes.  Pertinent labs & imaging results that were available during my care of the patient were reviewed by me and considered in my medical decision making (see chart for details).  Patient is a very pleasant, nontoxic-appearing 76 year old male here for evaluation of redness on the left inner thigh.  The area of redness is oval in nature, flat, and mildly warm.  There is no induration or fluctuance noted.  There is a central oval area of redness followed by a dark line from where the patient circled it and marker and there is redness extending out beyond it.  There is no definitive site of envenomation or scab to indicate possible tick bite.  He has no swollen lymph nodes in his groin.  I will treat him for cellulitis but cover him for tickborne illness with doxycycline twice daily for 10 days.  I have advised him that the redness may continue to ask and outward for the next 48 hours until he gets therapeutic blood levels of antibiotics on board.  If the redness continues to expand after that timeframe he should return for reevaluation.  Also if the area becomes swollen, hard, or if he develops a fever.   Final Clinical Impressions(s) / UC Diagnoses   Final diagnoses:  Cellulitis of left lower extremity     Discharge Instructions      Take the Doxycycline twice daily with food for 10 days.  Doxycycline will make you more sensitive to sunburn so wear  sunscreen when outdoors and reapply it every 90 minutes.  Keep an eye on the redness and monitor for any increases. If the area becomes hard or spongy return for re-evaluation.  Use OTC Tylenol and Ibuprofen according to the package instructions as needed for pain.  Return for new or worsening symptoms.  Such as joint pain, headache, or fever.     ED Prescriptions     Medication Sig Dispense Auth. Provider   doxycycline (VIBRAMYCIN) 100 MG capsule Take 1 capsule (100 mg total) by mouth 2 (two) times daily. 20 capsule Margarette Canada, NP      PDMP not reviewed this encounter.   Margarette Canada, NP 08/18/21 1435

## 2021-12-05 ENCOUNTER — Ambulatory Visit
Admission: EM | Admit: 2021-12-05 | Discharge: 2021-12-05 | Disposition: A | Discharge status: Home / Self Care | Payer: Medicare HMO | Attending: Emergency Medicine | Admitting: Emergency Medicine

## 2021-12-05 ENCOUNTER — Other Ambulatory Visit: Payer: Self-pay

## 2021-12-05 DIAGNOSIS — J0100 Acute maxillary sinusitis, unspecified: Secondary | ICD-10-CM | POA: Diagnosis not present

## 2021-12-05 DIAGNOSIS — B9689 Other specified bacterial agents as the cause of diseases classified elsewhere: Secondary | ICD-10-CM

## 2021-12-05 DIAGNOSIS — H109 Unspecified conjunctivitis: Secondary | ICD-10-CM

## 2021-12-05 MED ORDER — AMOXICILLIN-POT CLAVULANATE 875-125 MG PO TABS: 1.0000 | ORAL_TABLET | Freq: Two times a day (BID) | ORAL | 0 refills | Status: AC

## 2021-12-05 MED ORDER — FLUTICASONE PROPIONATE 50 MCG/ACT NA SUSP: 1.0000 | Freq: Every day | NASAL | 0 refills | Status: AC

## 2021-12-05 MED ORDER — MOXIFLOXACIN HCL 0.5 % OP SOLN: 1.0000 [drp] | Freq: Three times a day (TID) | OPHTHALMIC | 0 refills | Status: DC

## 2021-12-05 NOTE — ED Provider Notes (Signed)
[facility]    CSN: [id number] Arrival date & time: 12/05/21  0909      History   Chief Complaint Chief Complaint  Patient presents with   Eye Drainage   Dental Pain    HPI Nykolas Bacallao is a 76 y.o. male.   Patient presents with yellow eye drainage, bilateral erythema and burning sensation for 6 days.  Is been experiencing a nonproductive cough, nasal congestion, sinus pain and pressure, sinus headaches and bilateral ear pressure for 6 days.  No known sick contacts but endorses that he was on a bus trip prior to symptoms beginning.  Tolerating food and liquids.  Has attempted use of salt rinses, Mucinex, refresher eyedrops and dry drops which have been ineffective.  Denies shortness of breath, wheezing, visual disturbance or light sensitivity.  History of seasonal allergies   Past Medical History:  Diagnosis Date   Arthritis    left shoulder   Cancer ([facility])    skin   Kidney stones    Seasonal allergies    Wears glasses     Patient Active Problem List   Diagnosis Date Noted   Colon cancer screening    Rectal polyp     Past Surgical History:  Procedure Laterality Date   BACK SURGERY     COLONOSCOPY     COLONOSCOPY WITH PROPOFOL N/A 07/20/2017   Procedure: COLONOSCOPY WITH PROPOFOL;  Surgeon: Lucilla Lame, MD;  Location: [place];  Service: Endoscopy;  Laterality: N/A;   COLONOSCOPY WITH PROPOFOL N/A 07/21/2017   Procedure: COLONOSCOPY WITH PROPOFOL;  Surgeon: Lucilla Lame, MD;  Location: [facility] ENDOSCOPY;  Service: Endoscopy;  Laterality: N/A;   POLYPECTOMY  07/20/2017   Procedure: POLYPECTOMY;  Surgeon: Lucilla Lame, MD;  Location: [facility];  Service: Endoscopy;;   RECONSTRUCTION OF NOSE     broken   SKIN CANCER EXCISION         Home Medications    Prior to Admission medications   Medication Sig Start Date End Date Taking? Authorizing Provider  co-enzyme Q-10 30 MG capsule Take 30 mg by mouth 3 (three) times daily.    [provider]  doxycycline (VIBRAMYCIN) 100 MG capsule Take 1 capsule (100 mg total) by mouth 2 (two) times daily. 08/18/21   Margarette Canada, NP  RED YEAST RICE EXTRACT PO Take by mouth daily.    [provider]  rosuvastatin (CRESTOR) 10 MG tablet Take 10 mg by mouth daily. 07/05/21   [provider]    Family History History reviewed. No pertinent family history.  Social History Social History   Tobacco Use   Smoking status: Never   Smokeless tobacco: Never  Vaping Use   Vaping Use: Never used  Substance Use Topics   Alcohol use: Yes    Comment: rarely   Drug use: Never     Allergies   Patient has no known allergies.   Review of Systems Review of Systems  Constitutional: Negative.   HENT:  Positive for congestion, ear pain, sinus pressure and sinus pain. Negative for dental problem, drooling, ear discharge, facial swelling, hearing loss, mouth sores, nosebleeds, postnasal drip, rhinorrhea, sneezing, sore throat, tinnitus, trouble swallowing and voice change.   Eyes:  Positive for pain, discharge and redness. Negative for photophobia, itching and visual disturbance.  Respiratory:  Positive for cough. Negative for apnea, choking, chest tightness, shortness of breath, wheezing and stridor.   Cardiovascular: Negative.   Skin: Negative.   Neurological:  Positive for headaches.  Negative for dizziness, tremors, seizures, syncope, facial asymmetry, speech difficulty, weakness, light-headedness and numbness.     Physical Exam Triage Vital Signs ED Triage Vitals  Enc Vitals Group     BP 12/05/21 0931 139/87     Pulse Rate 12/05/21 0931 76     Resp 12/05/21 0931 17     Temp 12/05/21 0931 98.8 F (37.1 C)     Temp Source 12/05/21 0931 Oral     SpO2 12/05/21 0931 99 %     Weight 12/05/21 0928 195 lb (88.5 kg)     Height 12/05/21 0928 '5\' 11"'$  (1.803 m)     Head Circumference --      Peak Flow --      Pain Score 12/05/21 0928 4     Pain Loc --      Pain Edu?  --      Excl. in [place]? --    No data found.  Updated Vital Signs BP 139/87 (BP Location: Left Arm)   Pulse 76   Temp 98.8 F (37.1 C) (Oral)   Resp 17   Ht '5\' 11"'$  (1.803 m)   Wt 195 lb (88.5 kg)   SpO2 99%   BMI 27.20 kg/m   Visual Acuity Right Eye Distance:   Left Eye Distance:   Bilateral Distance:    Right Eye Near:   Left Eye Near:    Bilateral Near:     Physical Exam Constitutional:      Appearance: Normal appearance.  HENT:     Right Ear: Hearing, ear canal and external ear normal. A middle ear effusion is present.     Left Ear: Hearing, ear canal and external ear normal. A middle ear effusion is present.     Nose:     Right Sinus: Maxillary sinus tenderness present. No frontal sinus tenderness.     Left Sinus: Maxillary sinus tenderness present. No frontal sinus tenderness.     Mouth/Throat:     Mouth: Mucous membranes are moist.     Pharynx: Oropharynx is clear. Posterior oropharyngeal erythema present.  Eyes:     Comments: Bilateral erythema to the conjunctive, no drainage noted, vision grossly intact, extraocular movements intact  Cardiovascular:     Rate and Rhythm: Normal rate and regular rhythm.     Pulses: Normal pulses.     Heart sounds: Normal heart sounds.  Pulmonary:     Effort: Pulmonary effort is normal.     Breath sounds: Normal breath sounds.  Musculoskeletal:     Cervical back: Normal range of motion and neck supple.  Neurological:     Mental Status: He is alert and oriented to person, place, and time. Mental status is at baseline.  Psychiatric:        Mood and Affect: Mood normal.        Behavior: Behavior normal.      UC Treatments / Results  Labs (all labs ordered are listed, but only abnormal results are displayed) Labs Reviewed - No data to display  EKG   Radiology No results found.  Procedures Procedures (including critical care time)  Medications Ordered in UC Medications - No data to display  Initial Impression /  Assessment and Plan / UC Course  I have reviewed the triage vital signs and the nursing notes.  Pertinent labs & imaging results that were available during my care of the patient were reviewed by me and considered in my medical decision making (see chart for details).  ACute  nonrecurrent maxillary sinusitis, gingivitis of both eyes  Vitals signs stable patient while ill-appearing is in no signs of distress presentation are consistent with above diagnoses, discussed with patient, moxifloxacin and oral Augmentin prescribed, discussed administration, prescribed Flonase for additional support, recommended continued use of supportive measures Final Clinical Impressions(s) / UC Diagnoses   Final diagnoses:  None   Discharge Instructions   None    ED Prescriptions   None    PDMP not reviewed this encounter.   Hans Eden, NP 12/05/21 1022

## 2021-12-05 NOTE — Discharge Instructions (Signed)
Today you being treated for bacterial conjunctivitis and a sinus infection  Place 1 drop of moxifloxacin into each hours every 8 hours for the next 7 days to get rid of bacteria to the office  Begin use of Augmentin every morning and every evening for the next 10 days to provide coverage for the sinuses  May continue use of Mucinex and saline rinses.  Begin use of Flonase every morning to help further clear the sinuses which will help reduce your pain May use cool compress for comfort and to remove discharge if present. Pat the eye, do not wipe.  If wearing contacts, dispose of current pair. Wear glasses until symptoms have resolved.   Do not rub eyes, this may cause more irritation.  If symptoms persist after use of medication, please follow up at Urgent Care or with ophthalmologist (eye doctor)

## 2021-12-05 NOTE — ED Triage Notes (Signed)
Pt states he has had eye drainage from both eyes and burning/redness since Saturday. Pressure in ears and dull headache over his eyes. Teeth hurt at night and soreness in spot on his facial area. Also with congestion and cough started yesterday with a dry cough.

## 2023-02-09 DIAGNOSIS — K080 Exfoliation of teeth due to systemic causes: Secondary | ICD-10-CM | POA: Diagnosis not present

## 2023-02-12 DIAGNOSIS — K080 Exfoliation of teeth due to systemic causes: Secondary | ICD-10-CM | POA: Diagnosis not present

## 2023-03-16 DIAGNOSIS — H43813 Vitreous degeneration, bilateral: Secondary | ICD-10-CM | POA: Diagnosis not present

## 2023-03-16 DIAGNOSIS — H26493 Other secondary cataract, bilateral: Secondary | ICD-10-CM | POA: Diagnosis not present

## 2023-06-26 DIAGNOSIS — Z125 Encounter for screening for malignant neoplasm of prostate: Secondary | ICD-10-CM | POA: Diagnosis not present

## 2023-06-26 DIAGNOSIS — E785 Hyperlipidemia, unspecified: Secondary | ICD-10-CM | POA: Diagnosis not present

## 2023-06-26 DIAGNOSIS — N400 Enlarged prostate without lower urinary tract symptoms: Secondary | ICD-10-CM | POA: Diagnosis not present

## 2023-06-26 DIAGNOSIS — Z0000 Encounter for general adult medical examination without abnormal findings: Secondary | ICD-10-CM | POA: Diagnosis not present

## 2023-07-15 DIAGNOSIS — N400 Enlarged prostate without lower urinary tract symptoms: Secondary | ICD-10-CM | POA: Diagnosis not present

## 2023-07-15 DIAGNOSIS — E785 Hyperlipidemia, unspecified: Secondary | ICD-10-CM | POA: Diagnosis not present

## 2023-09-08 ENCOUNTER — Ambulatory Visit (INDEPENDENT_AMBULATORY_CARE_PROVIDER_SITE_OTHER): Admitting: Family Medicine

## 2023-09-08 ENCOUNTER — Encounter: Payer: Self-pay | Admitting: Family Medicine

## 2023-09-08 VITALS — BP 150/89 | HR 84 | Temp 97.7°F | Resp 18 | Ht 71.0 in | Wt 194.0 lb

## 2023-09-08 DIAGNOSIS — J329 Chronic sinusitis, unspecified: Secondary | ICD-10-CM | POA: Insufficient documentation

## 2023-09-08 DIAGNOSIS — N401 Enlarged prostate with lower urinary tract symptoms: Secondary | ICD-10-CM | POA: Insufficient documentation

## 2023-09-08 DIAGNOSIS — K5900 Constipation, unspecified: Secondary | ICD-10-CM | POA: Insufficient documentation

## 2023-09-08 DIAGNOSIS — K5904 Chronic idiopathic constipation: Secondary | ICD-10-CM | POA: Diagnosis not present

## 2023-09-08 DIAGNOSIS — R030 Elevated blood-pressure reading, without diagnosis of hypertension: Secondary | ICD-10-CM | POA: Diagnosis not present

## 2023-09-08 MED ORDER — TAMSULOSIN HCL 0.4 MG PO CAPS: 0.4000 mg | ORAL_CAPSULE | Freq: Every day | ORAL | 3 refills | Status: DC

## 2023-09-08 NOTE — Assessment & Plan Note (Signed)
 Trial of fluticasone  2 sprays each nostril daily.

## 2023-09-08 NOTE — Assessment & Plan Note (Signed)
 Gave him his sheet to record his blood pressures on.  Check your blood pressure at home at various times be still sure to sit still for 10 minutes prior to checking your blood pressure.  Drop these readings often if they are elevated I will ask you to make an appointment.

## 2023-09-08 NOTE — Progress Notes (Signed)
 New Patient Office Visit  Subjective    Patient ID: Paul James, male    DOB: 1945/07/22  Age: 78 y.o. MRN: [id number]  CC:  Chief Complaint  Patient presents with   Establish Care   Sinus Problem    Reoccuring.   Constipation    Chronic. Takes daily stool softner    HPI Paul James presents to establish care Delightful 78 year old gentleman establishing care.   He had lumbar surgery at age 47 for ruptured disc he still has numbness and tingling in his great toe on the left side. He has chronic problems with constipation and takes Colace daily.  At times this is not enough usually when he is traveling or out of his normal routine.  He had a colonoscopy 08/22/2017 that was negative.  He expects to have a colonoscopy at age 3 because he intends to live a long time. He gets recurrent maxillary sinusitis and wonders what he can do to prevent this.  Is not really aware that he has allergies or swelling in his nose. 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  This shows up periodically and it is always in his left eye in the medial field He takes Crestor 10 mg for his cholesterol and he is on co-Q10 enzyme 100 mg a day. He is on an over-the-counter prostate supplement.  He tried saw palmetto but it had no effect.  Reports his PSA runs about 1 all the time.  He does have LUTS and gets up 2 times at night to void.  Once he wakes up if he does not void he cannot go back to sleep.  He has had the symptoms for the last 2 to 3 years.  Also feel like he does not get completely emptied and has to double void during the day.  He has never tried tamsulosin . Reports she has had skin cancer but does not know the kind.  He does see dermatology periodically. Walks 1.5 to 2 miles a day. Blood pressure is mildly elevated in the office today.  Reports she has never had issues with hypertension.  It may be whitecoat hypertension as it is the first time he has been here.  Outpatient Encounter  Medications as of 09/08/2023  Medication Sig   carboxymethylcellulose (REFRESH PLUS) 0.5 % SOLN Place 1 drop into both eyes daily as needed (dry eyes).   co-enzyme Q-10 30 MG capsule Take 30 mg by mouth 3 (three) times daily.   docusate sodium (COLACE) 100 MG capsule Take 100 mg by mouth daily.   fluticasone  (FLONASE ) 50 MCG/ACT nasal spray Place 1 spray into both nostrils daily.   ketotifen (ZADITOR) 0.035 % ophthalmic solution Place 1 drop into both eyes in the morning and at bedtime.   Propylene Glycol (SYSTANE COMPLETE OP) Apply 1 drop to eye as needed.   rosuvastatin (CRESTOR) 10 MG tablet Take 10 mg by mouth daily.   tamsulosin  (FLOMAX ) 0.4 MG CAPS capsule Take 1 capsule (0.4 mg total) by mouth daily after breakfast.   [DISCONTINUED] moxifloxacin  (VIGAMOX ) 0.5 % ophthalmic solution Place 1 drop into both eyes 3 (three) times daily. (Patient not taking: Reported on 09/08/2023)   [DISCONTINUED] RED YEAST RICE EXTRACT PO Take by mouth daily.   No facility-administered encounter medications on file as of 09/08/2023.    Past Medical History:  Diagnosis Date   Arthritis    left shoulder   Cancer ([facility])    skin   Kidney stones  Seasonal allergies    Wears glasses     Past Surgical History:  Procedure Laterality Date   BACK SURGERY     COLONOSCOPY     COLONOSCOPY WITH PROPOFOL  N/A 07/20/2017   Procedure: COLONOSCOPY WITH PROPOFOL ;  Surgeon: Jinny Carmine, MD;  Location: [facility];  Service: Endoscopy;  Laterality: N/A;   COLONOSCOPY WITH PROPOFOL  N/A 07/21/2017   Procedure: COLONOSCOPY WITH PROPOFOL ;  Surgeon: Jinny Carmine, MD;  Location: [facility] ENDOSCOPY;  Service: Endoscopy;  Laterality: N/A;   POLYPECTOMY  07/20/2017   Procedure: POLYPECTOMY;  Surgeon: Jinny Carmine, MD;  Location: [facility];  Service: Endoscopy;;   RECONSTRUCTION OF NOSE     broken   SKIN CANCER EXCISION      History reviewed. No pertinent family history.  Social History   Socioeconomic History    Marital status: Married    Spouse name: Not on file   Number of children: Not on file   Years of education: Not on file   Highest education level: Not on file  Occupational History   Not on file  Tobacco Use   Smoking status: Never   Smokeless tobacco: Never  Vaping Use   Vaping status: Never Used  Substance and Sexual Activity   Alcohol use: Yes    Comment: rarely   Drug use: Never   Sexual activity: Not on file  Other Topics Concern   Not on file  Social History Narrative   Not on file   Social Drivers of Health   Financial Resource Strain: Not on file  Food Insecurity: Not on file  Transportation Needs: Not on file  Physical Activity: Not on file  Stress: Not on file  Social Connections: Not on file  Intimate Partner Violence: Not on file    ROS      Objective   BP (!) 150/89   Pulse 84   Temp 97.7 F (36.5 C) (Oral)   Resp 18   Ht 5' 11 (1.803 m)   Wt 194 lb (88 kg)   SpO2 97%   BMI 27.06 kg/m    Physical Exam Vitals and nursing note reviewed.  Constitutional:      Appearance: Normal appearance.  HENT:     Head: Normocephalic and atraumatic.  Eyes:     Conjunctiva/sclera: Conjunctivae normal.  Cardiovascular:     Rate and Rhythm: Normal rate and regular rhythm.  Pulmonary:     Effort: Pulmonary effort is normal.     Breath sounds: Normal breath sounds.  Musculoskeletal:     Right lower leg: No edema.     Left lower leg: No edema.  Skin:    General: Skin is warm and dry.  Neurological:     Mental Status: He is alert and oriented to person, place, and time.  Psychiatric:        Mood and Affect: Mood normal.        Behavior: Behavior normal.        Thought Content: Thought content normal.        Judgment: Judgment normal.            The ASCVD Risk score (Arnett DK, et al., 2019) failed to calculate for the following reasons:   Cannot find a previous HDL lab   Cannot find a previous total cholesterol lab     Assessment &  Plan:  Elevated blood pressure reading Assessment & Plan: Gave him his sheet to record his blood pressures on.  Check your blood  pressure at home at various times be still sure to sit still for 10 minutes prior to checking your blood pressure.  Drop these readings often if they are elevated I will ask you to make an appointment.   Recurrent sinusitis Assessment & Plan: Trial of fluticasone  2 sprays each nostril daily.     Benign localized prostatic hyperplasia with lower urinary tract symptoms (LUTS) Assessment & Plan: Has nocturia x 2 and is unable to go back to sleep if he does not void.  Has sensation that he does not completely empty his bladder during the day and has some dribbling.  Trial tamsulosin  0.4 mg nightly  Orders: -     Tamsulosin  HCl; Take 1 capsule (0.4 mg total) by mouth daily after breakfast.  Dispense: 30 capsule; Refill: 3  Chronic idiopathic constipation Assessment & Plan: When you find Colace is not working you might try MiraLAX 70 g in 8 ounces of water .     Return in about 6 months (around 03/10/2024).   Rikayla Demmon K Ladainian Therien, MD

## 2023-09-08 NOTE — Assessment & Plan Note (Signed)
 Has nocturia x 2 and is unable to go back to sleep if he does not void.  Has sensation that he does not completely empty his bladder during the day and has some dribbling.  Trial tamsulosin  0.4 mg nightly

## 2023-09-08 NOTE — Assessment & Plan Note (Signed)
 When you find Colace is not working you might try MiraLAX 70 g in 8 ounces of water .

## 2023-12-14 ENCOUNTER — Ambulatory Visit

## 2023-12-16 DIAGNOSIS — D225 Melanocytic nevi of trunk: Secondary | ICD-10-CM | POA: Diagnosis not present

## 2023-12-16 DIAGNOSIS — D2272 Melanocytic nevi of left lower limb, including hip: Secondary | ICD-10-CM | POA: Diagnosis not present

## 2023-12-16 DIAGNOSIS — L570 Actinic keratosis: Secondary | ICD-10-CM | POA: Diagnosis not present

## 2023-12-16 DIAGNOSIS — D2262 Melanocytic nevi of left upper limb, including shoulder: Secondary | ICD-10-CM | POA: Diagnosis not present

## 2023-12-16 DIAGNOSIS — D2261 Melanocytic nevi of right upper limb, including shoulder: Secondary | ICD-10-CM | POA: Diagnosis not present

## 2024-01-26 ENCOUNTER — Other Ambulatory Visit: Payer: Self-pay | Admitting: Family Medicine

## 2024-01-26 DIAGNOSIS — N401 Enlarged prostate with lower urinary tract symptoms: Secondary | ICD-10-CM

## 2024-02-18 ENCOUNTER — Ambulatory Visit: Admitting: Family Medicine

## 2024-02-18 VITALS — BP 121/81 | HR 87 | Temp 98.1°F | Resp 16 | Wt 197.6 lb

## 2024-02-18 DIAGNOSIS — K5904 Chronic idiopathic constipation: Secondary | ICD-10-CM | POA: Diagnosis not present

## 2024-02-18 DIAGNOSIS — N401 Enlarged prostate with lower urinary tract symptoms: Secondary | ICD-10-CM

## 2024-02-18 DIAGNOSIS — E782 Mixed hyperlipidemia: Secondary | ICD-10-CM | POA: Diagnosis not present

## 2024-02-18 DIAGNOSIS — J3089 Other allergic rhinitis: Secondary | ICD-10-CM

## 2024-02-18 DIAGNOSIS — J309 Allergic rhinitis, unspecified: Secondary | ICD-10-CM | POA: Insufficient documentation

## 2024-02-18 NOTE — Assessment & Plan Note (Signed)
 Managed with docusate 50mg  daily.  If he travels he has to increase the docusate to 100mg  daily for efficacy.

## 2024-02-18 NOTE — Assessment & Plan Note (Signed)
 Managed with tamsulosin  0.4 mg daily.  Still has to get up twice overnight to void but he does not have hesitancy and dribbling has stopped.  Continue tamsulosin 

## 2024-02-18 NOTE — Assessment & Plan Note (Signed)
 On rosuvastatin 10 mg daily.  He is also taking coq.10 enzyme 100 mg daily.  We will check his labs at his physical.

## 2024-02-18 NOTE — Assessment & Plan Note (Signed)
 Has not had a sinus infection since starting Flonase .  Finds it is effective to keep his sinuses open.  Please continue.

## 2024-02-18 NOTE — Progress Notes (Signed)
 "  Established Patient Office Visit  Subjective   Patient ID: Paul James, male    DOB: 1945/12/04  Age: 79 y.o. MRN: [id number]  Chief Complaint  Patient presents with   Follow-up    Pt. Here for a follow-up visit for Sinus and Constipation. Pt. Denies pain at this time.     HPI Discussed the use of AI scribe software for clinical note transcription with the patient, who gave verbal consent to proceed.  History of Present Illness   Paul James is a 79 year old male who presents with numbness and tingling in his great toe and constipation management.  He experiences numbness and tingling in his great toe, persisting since surgery for a ruptured disc at age 54. These symptoms are generally not bothersome but can worsen with certain activities. He maintains an active lifestyle, walking two miles daily and performing morning stretches to manage his back condition post-surgery.  He manages constipation with docusate (Colace), initially taking 100 mg daily, which caused overly loose stools. He adjusted the dose to 50 mg daily, which is effective. Bowel movements become less frequent during travel or special events, and he avoids stimulant laxatives due to potential side effects.  He uses a generic version of Flonase  for sinus issues, particularly in winter, administering it once daily as needed. This has helped his sinuses. He is also on rosuvastatin for cholesterol management and tamsulosin  0.4mg  nightly.  H e still wakes up twice at night to urinate but he no longer has hesitancy or dribbling.    He mentions a decline in hearing but is not ready to pursue hearing aids. He manages by increasing the volume on the TV and can still communicate effectively on the phone.  He has received a flu shot and is up to date on his vaccines.        Objective:     BP 121/81 (Cuff Size: Normal)   Pulse 87   Temp 98.1 F (36.7 C) (Oral)   Resp 16   Wt 197 lb 9.6 oz (89.6 kg)   SpO2 97%   BMI 27.56  kg/m    Physical Exam Vitals and nursing note reviewed.  Constitutional:      Appearance: Normal appearance.  HENT:     Head: Normocephalic and atraumatic.  Eyes:     Conjunctiva/sclera: Conjunctivae normal.  Cardiovascular:     Rate and Rhythm: Normal rate and regular rhythm.  Pulmonary:     Effort: Pulmonary effort is normal.     Breath sounds: Normal breath sounds.  Musculoskeletal:     Right lower leg: No edema.     Left lower leg: No edema.  Skin:    General: Skin is warm and dry.  Neurological:     Mental Status: He is alert and oriented to person, place, and time.  Psychiatric:        Mood and Affect: Mood normal.        Behavior: Behavior normal.        Thought Content: Thought content normal.        Judgment: Judgment normal.          No results found for any visits on 02/18/24.    The ASCVD Risk score (Arnett DK, et al., 2019) failed to calculate for the following reasons:   Cannot find a previous HDL lab   Cannot find a previous total cholesterol lab   * - Cholesterol units were assumed    Assessment & Plan:  Chronic idiopathic constipation Assessment & Plan: Managed with docusate 50mg  daily.  If he travels he has to increase the docusate to 100mg  daily for efficacy.     Benign localized prostatic hyperplasia with lower urinary tract symptoms (LUTS) Assessment & Plan: Managed with tamsulosin  0.4 mg daily.  Still has to get up twice overnight to void but he does not have hesitancy and dribbling has stopped.  Continue tamsulosin    Mixed hyperlipidemia Assessment & Plan: On rosuvastatin 10 mg daily.  He is also taking coq.10 enzyme 100 mg daily.  We will check his labs at his physical.   Non-seasonal allergic rhinitis due to other allergic trigger Assessment & Plan: Has not had a sinus infection since starting Flonase .  Finds it is effective to keep his sinuses open.  Please continue.      Return for physical exam with labs one week prior.     Lorilei Horan K Enedina Pair, MD "

## 2024-03-10 ENCOUNTER — Ambulatory Visit: Admitting: Family Medicine

## 2024-07-06 ENCOUNTER — Ambulatory Visit

## 2024-07-25 ENCOUNTER — Encounter: Admitting: Family Medicine
